# Patient Record
Sex: Female | Born: 1995 | Race: Black or African American | Hispanic: No | Marital: Single | Smoking: Former smoker
Health system: Southern US, Community
[De-identification: ages and names within clinical notes are randomized; demographics above are authoritative.]

## PROBLEM LIST (undated history)

## (undated) DIAGNOSIS — I10 Essential (primary) hypertension: Secondary | ICD-10-CM

## (undated) DIAGNOSIS — E559 Vitamin D deficiency, unspecified: Secondary | ICD-10-CM

## (undated) DIAGNOSIS — D649 Anemia, unspecified: Secondary | ICD-10-CM

## (undated) DIAGNOSIS — N189 Chronic kidney disease, unspecified: Secondary | ICD-10-CM

## (undated) HISTORY — PX: NO PAST SURGERIES: SHX2092

## (undated) HISTORY — DX: Essential (primary) hypertension: I10

## (undated) HISTORY — DX: Chronic kidney disease, unspecified: N18.9

---
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
## 2017-09-08 IMAGING — CT CT NECK W/ CM
4 of 5 series · 15 of 33 positions shown, 17 images · IV contrast (iopamidol)
Comparison: None.

CLINICAL DATA: Initial evaluation for 4 day history of acute sore
throat.

EXAM:
CT NECK WITH CONTRAST
TECHNIQUE: Multidetector CT imaging of the neck was performed using the
standard protocol following the bolus administration of intravenous
contrast.
CONTRAST:  75mL 4FSLW9-XOO IOPAMIDOL (4FSLW9-XOO) INJECTION 61%

[Series 3: neck with st · axial · 0.41mm/px · z∈[-229,-113]mm · 3 of 118 slices shown, 4 images]
[im 30/118  soft-tissue]
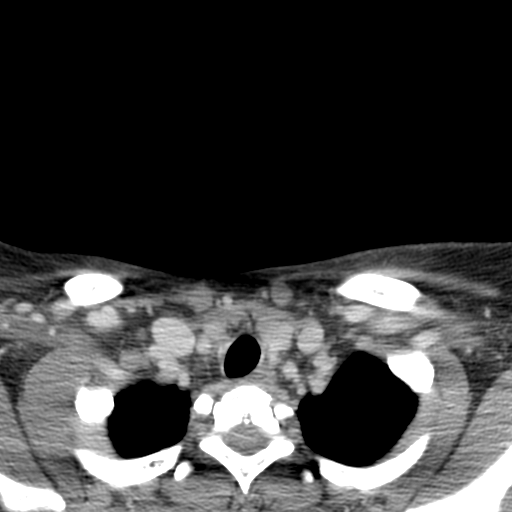
[im 30/118  bone]
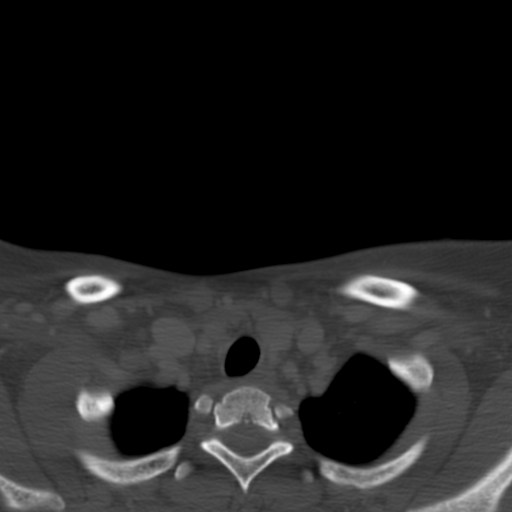
[im 59/118  bone]
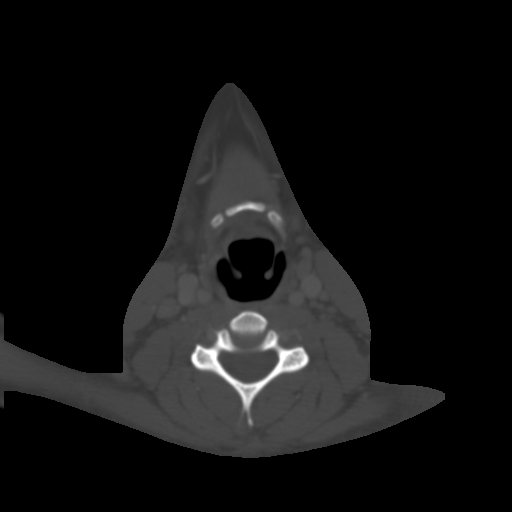
[im 88/118  bone]
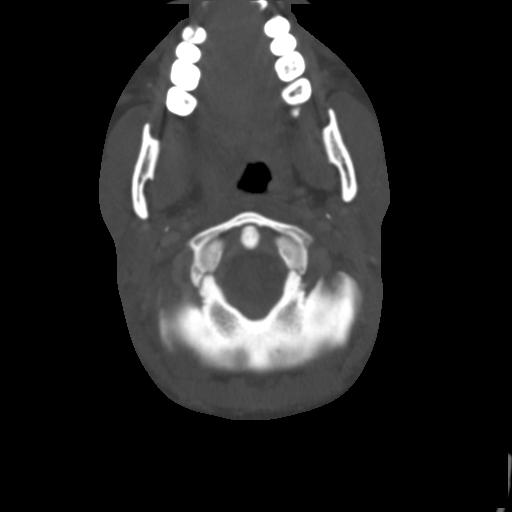

[Series 6: coronal st · coronal · 0.51mm/px · 3 of 98 slices shown]
[im 33/98  bone]
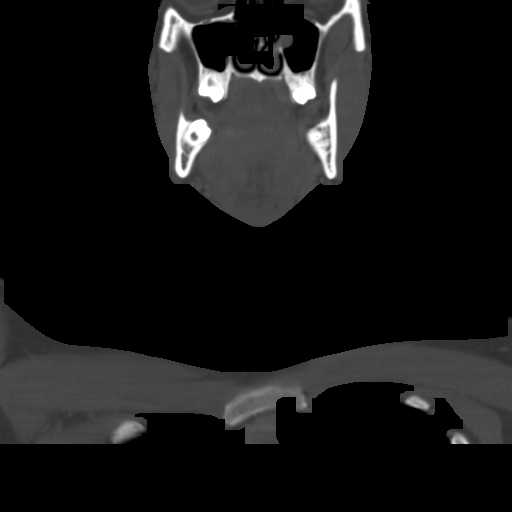
[im 44/98  bone]
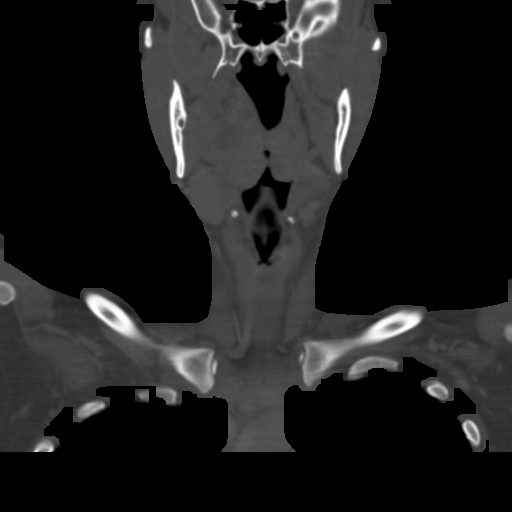
[im 55/98  bone]
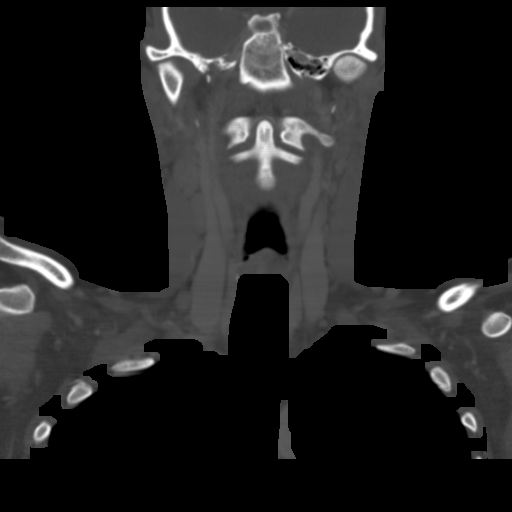

[Series 7: sagittal st · sagittal · 0.43mm/px · 5 of 101 slices shown, 6 images]
[im 34/101  bone]
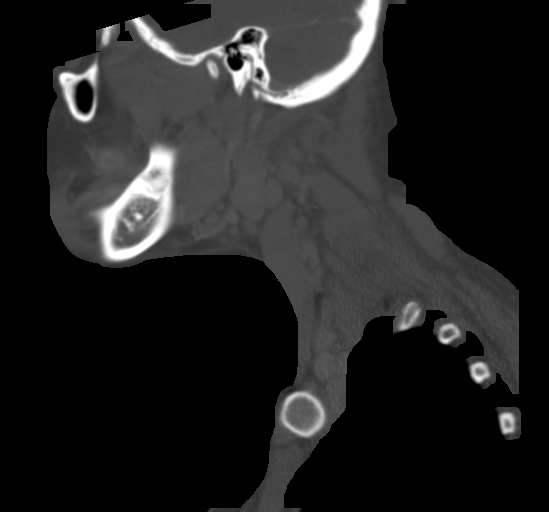
[im 42/101  bone]
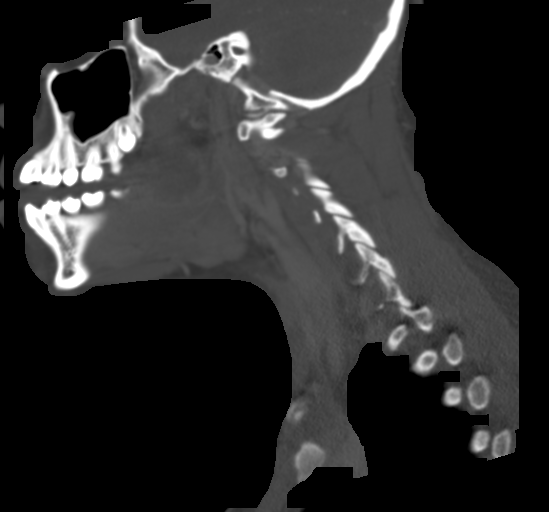
[im 51/101  soft-tissue]
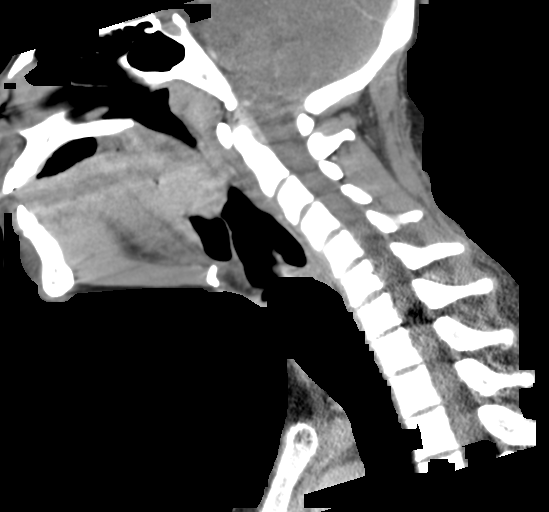
[im 51/101  bone]
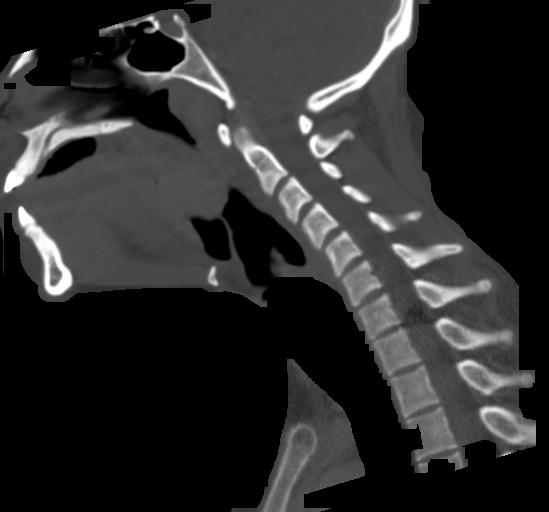
[im 59/101  bone]
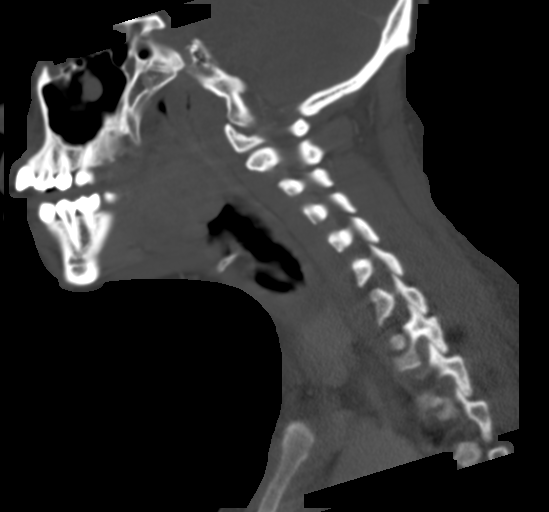
[im 67/101  bone]
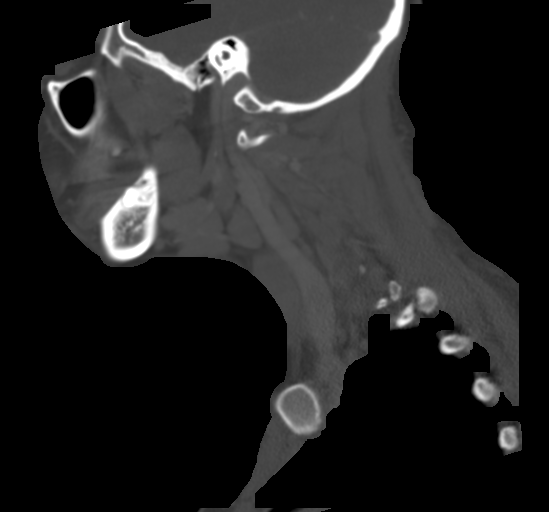

[Series 8: axial recons · axial · 0.39mm/px · z∈[-248,-131]mm · 4 of 117 slices shown]
[im 24/117  bone]
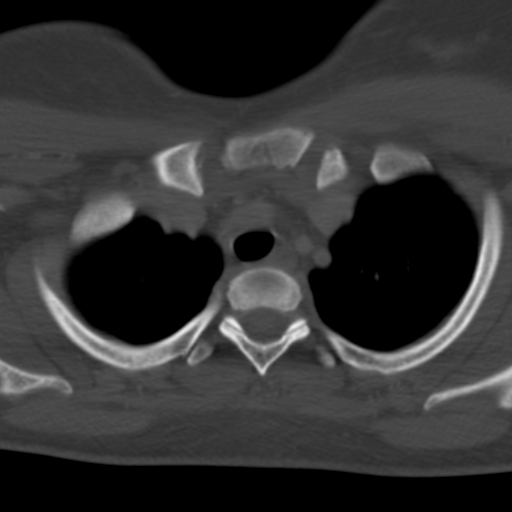
[im 47/117  bone]
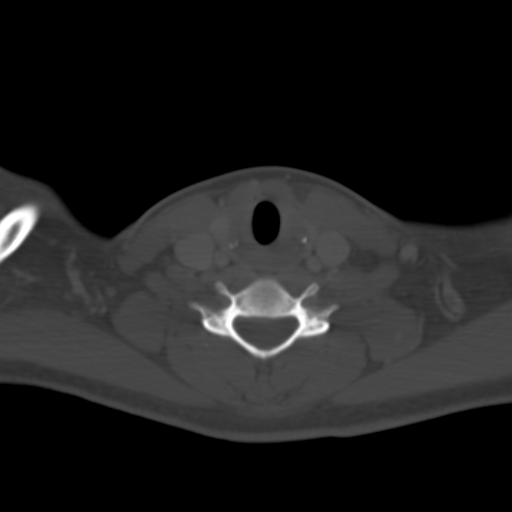
[im 70/117  bone]
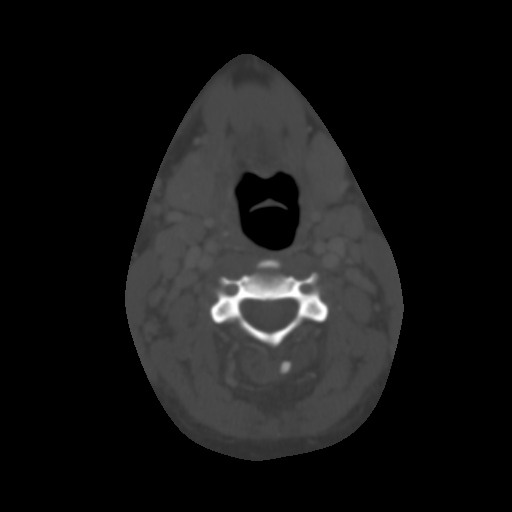
[im 93/117  bone]
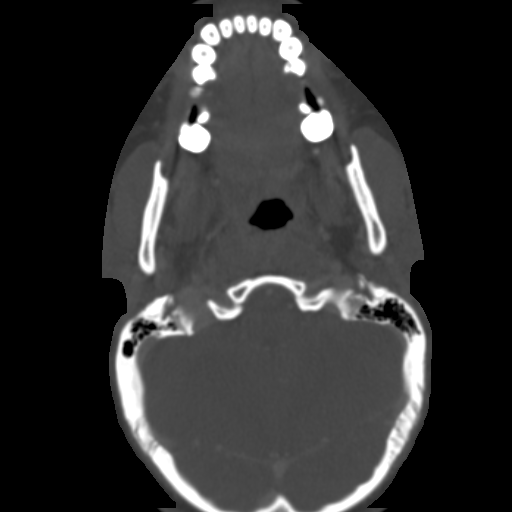

[15 of 33 positions shown; findings below may reference images not displayed]

FINDINGS: Visualized portions of the brain are within normal limits.
Visualized globes and orbits unremarkable.

Mild polypoid mucosal thickening within the maxillary sinuses.
Remainder the visualized paranasal sinuses are otherwise clear. No
mastoid effusion. Middle ear cavities are well pneumatized.

Salivary glands including the parotid glands and submandibular
glands are normal.

Oral cavity within normal limits. Visualized dentition unremarkable.

Palatine tonsils are enlarged and hyper enhancing, compatible with
acute tonsillitis. Tonsils abut at the midline. There is a loculated
hypodense rim enhancing collection just lateral to the right tonsil
that measures 2.8 x 2.0 x 2.5 cm, compatible with peritonsillar
abscess (Series 3, image 36). While there is some anterior extension
of this collection towards the posterior right mandibular molars,
just medial to the retromolar trigone, no significant dental caries
or other abnormalities to suggest an odontogenic origin are seen.
Associated induration and inflammatory stranding within the right
parapharyngeal fat.

Nasopharynx within normal limits. No retropharyngeal fluid
collection. Epiglottis normal. Vallecula clear. Remainder of the
hypopharynx and supraglottic larynx are normal. True cords symmetric
and normal. Subglottic airway clear.

Thyroid gland normal.

Mildly prominent bilateral level 2 lymph nodes measure up to 13 mm
on the right and 11 mm on the left, likely reactive.

Visualized mediastinum within normal limits.

Visualized lungs are clear.

Normal intravascular enhancement seen within the neck.

No acute osseous abnormality. No worrisome lytic or blastic osseous
lesions.
IMPRESSION: 1. Findings compatible with acute tonsillitis. Associated 2.8 x
x 2.5 cm right peritonsillar abscess as above.
2. Mildly prominent bilateral level 2 lymph nodes, likely reactive.

## 2018-06-07 ENCOUNTER — Telehealth: Payer: Self-pay | Admitting: *Deleted

## 2018-06-07 NOTE — Telephone Encounter (Signed)
Patient called wanting to schedule a appointment for pregnancy, patient states she is [redacted] weeks pregnant and she hasn't seen a doctor in a while. Please advise [phone]

## 2018-06-10 NOTE — Telephone Encounter (Signed)
I called the patient, left a message with her roommate to call back.

## 2018-06-10 NOTE — Telephone Encounter (Signed)
Patient called checking the status of this appointment, patient states she was in [place] Dr Mikey Bussing. Patient states she got a release form and that office told her they faxed over her records. Please advise

## 2018-06-14 NOTE — Telephone Encounter (Signed)
Patient called, stated that she has tried to get an appointment with [place] for two weeks.  I advised her that we needed [place] of state, once we have them our lead nurse will review and she will advised [place] what type of appointment to schedule her.  She stated she had the records sent, I advised her that we have not received anything yet.  She is going to request them to be faxed.

## 2018-06-25 ENCOUNTER — Telehealth: Payer: Self-pay | Admitting: *Deleted

## 2018-06-25 ENCOUNTER — Other Ambulatory Visit: Payer: Self-pay | Admitting: Obstetrics & Gynecology

## 2018-06-25 DIAGNOSIS — Z363 Encounter for antenatal screening for malformations: Secondary | ICD-10-CM

## 2018-06-25 NOTE — Telephone Encounter (Signed)
Patient informed that we are not allowing visitors or children to come to appointments at this time. Patient denies any contact with anyone suspected or confirmed of having COVID-19. Pt denies fever, cough, sob, muscle pain, diarrhea, rash, vomiting, abdominal pain, red eye, weakness, bruising or bleeding, joint pain or severe headache.  

## 2018-06-26 ENCOUNTER — Other Ambulatory Visit: Payer: Self-pay

## 2018-06-26 ENCOUNTER — Ambulatory Visit (INDEPENDENT_AMBULATORY_CARE_PROVIDER_SITE_OTHER): Payer: Self-pay | Admitting: Obstetrics and Gynecology

## 2018-06-26 ENCOUNTER — Encounter: Payer: Self-pay | Admitting: Obstetrics and Gynecology

## 2018-06-26 ENCOUNTER — Ambulatory Visit: Payer: Self-pay | Admitting: *Deleted

## 2018-06-26 ENCOUNTER — Ambulatory Visit (INDEPENDENT_AMBULATORY_CARE_PROVIDER_SITE_OTHER): Payer: Self-pay

## 2018-06-26 VITALS — BP 150/95 | HR 90 | Wt 185.0 lb

## 2018-06-26 DIAGNOSIS — Z148 Genetic carrier of other disease: Secondary | ICD-10-CM

## 2018-06-26 DIAGNOSIS — Z3A20 20 weeks gestation of pregnancy: Secondary | ICD-10-CM

## 2018-06-26 DIAGNOSIS — Z363 Encounter for antenatal screening for malformations: Secondary | ICD-10-CM

## 2018-06-26 DIAGNOSIS — Z1389 Encounter for screening for other disorder: Secondary | ICD-10-CM

## 2018-06-26 DIAGNOSIS — Z331 Pregnant state, incidental: Secondary | ICD-10-CM

## 2018-06-26 DIAGNOSIS — O10912 Unspecified pre-existing hypertension complicating pregnancy, second trimester: Secondary | ICD-10-CM

## 2018-06-26 DIAGNOSIS — Z8619 Personal history of other infectious and parasitic diseases: Secondary | ICD-10-CM

## 2018-06-26 DIAGNOSIS — O0990 Supervision of high risk pregnancy, unspecified, unspecified trimester: Secondary | ICD-10-CM

## 2018-06-26 DIAGNOSIS — O0992 Supervision of high risk pregnancy, unspecified, second trimester: Secondary | ICD-10-CM

## 2018-06-26 DIAGNOSIS — O10919 Unspecified pre-existing hypertension complicating pregnancy, unspecified trimester: Secondary | ICD-10-CM

## 2018-06-26 LAB — POCT URINALYSIS DIPSTICK OB
Blood, UA: NEGATIVE
Glucose, UA: NEGATIVE
Ketones, UA: NEGATIVE
Leukocytes, UA: NEGATIVE
Nitrite, UA: NEGATIVE

## 2018-06-26 NOTE — Progress Notes (Addendum)
[place] 20+5 wks,breech,cx 4.1 cm,posterior placenta gr 0,normal ovaries bilat,fhr 161 bpm,svp of fluid 4 cm,efw 372 g 44%,limited view of heart because of fetal position,please have pt come back for additional images,no obvious abnormalities

## 2018-06-26 NOTE — Progress Notes (Signed)
Pt did not take BP med this am.

## 2018-06-28 DIAGNOSIS — Z8619 Personal history of other infectious and parasitic diseases: Secondary | ICD-10-CM

## 2018-06-28 DIAGNOSIS — Z148 Genetic carrier of other disease: Secondary | ICD-10-CM | POA: Insufficient documentation

## 2018-06-28 DIAGNOSIS — A749 Chlamydial infection, unspecified: Secondary | ICD-10-CM | POA: Insufficient documentation

## 2018-06-28 NOTE — Progress Notes (Signed)
HIGH-RISK PREGNANCY VISIT Patient name: Shannon Juarez MRN [id number]  Date of birth: [birth year] Chief Complaint:   [place] from [place]. History of Present Illness:   Shannon Juarez is a 23 y.o. G1P0 female at 9135w0d with an Estimated Date of Delivery: 11/08/18 being seen today for ongoing management of a high-risk pregnancy complicated by chronic HTN.,  With documented renal disease, serum creatinine was 1.5 in 2017, this pregnancy creatinine is been documented at 1.21 and 1.1 she has documented baseline proteinuria with 24-hour total protein of 1900 mg in the early second trimester Today she reports no complaints.  She did not take her Procardia this morning she is on Procardia XL 30 once daily  Contractions: Not present. Vag. Bleeding: None.  Movement: Present. denies leaking of fluid.  Review of Systems:   Pertinent items are noted in HPI Denies abnormal vaginal discharge w/ itching/odor/irritation, headaches, visual changes, shortness of breath, chest pain, abdominal pain, severe nausea/vomiting, or problems with urination or bowel movements unless otherwise stated above. Pertinent History Reviewed:  Reviewed past medical,surgical, social, obstetrical and family history.  Reviewed problem list, medications and allergies. Physical Assessment:   Vitals:   06/26/18 1104  BP: (!) 150/95  Pulse: 90  Weight: 185 lb (83.9 kg)  Body mass index is 29.86 kg/m.           Physical Examination:   General appearance: alert, well appearing, and in no distress, oriented to person, place, and time and normal appearing weight  Mental status: alert, oriented to person, place, and time, normal mood, behavior, speech, dress, motor activity, and thought processes  Skin: warm & dry   Extremities: Edema: None    Cardiovascular: normal heart rate noted  Respiratory: normal respiratory effort, no distress  Abdomen: gravid, soft, non-tender  Pelvic: Cervical exam deferred          Fetal Status:     Movement: Present    Fetal Surveillance Testing today: growth u/s: TECHNICIAN COMMENTS:    US 20+5 wks,breech,cx 4.1 cm,posterior placenta gr 0,normal ovaries bilat,fhr 161 bpm,svp of fluid 4 cm,efw 372 g 44%,limited view of heart because of fetal position,please have pt come back for additional images,no obvious abnormalities   E. [employer]. [employer] Pontmber J Carl 06/26/2018 11:11 AM   Assessment & Plan:  1) High-risk pregnancy G1P0 at 935w0d with an Estimated Date of Delivery: 11/08/18   2) CHTN, stable will INCREASE PROCARDIA XL TO 30 BID.  3) RENAL DISEASE, stable                 WILL check 24 hr tp at 28, 36 wk. 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  Meds: No orders of the defined types were placed in this encounter.   Labs/procedures today: GC/CHL  Treatment Plan:  1 Increase procardia xl to 30 bid q 2 wk visit second trimester,(encourage BP cuff purchase),  2 24 hr TP at 28 , 26 wk 3 Ante testing BPP q wk p 28 wk. Growth scan q 4 wk.  4 Chl proof of cure today                                      Reviewed:  labor symptoms and general obstetric precautions including but not limited to vaginal bleeding, contractions, leaking of fluid  and fetal movement were reviewed in detail with the patient.  All questions were answered.  Follow-up: No follow-ups on file.  Orders Placed This Encounter  Procedures  . GC/Chlamydia Probe Amp  . Comprehensive metabolic panel  . CBC  . Comprehensive metabolic panel  . HIV Antibody (routine testing w rflx)  . POC Urinalysis Dipstick OB   Tilda Burrow CNM, [facility] 06/28/2018 9:13 AM

## 2018-06-29 LAB — GC/CHLAMYDIA PROBE AMP
Chlamydia trachomatis, NAA: NEGATIVE
Neisseria Gonorrhoeae by PCR: NEGATIVE

## 2018-07-03 ENCOUNTER — Telehealth: Payer: Self-pay | Admitting: Obstetrics and Gynecology

## 2018-07-03 NOTE — Telephone Encounter (Signed)
Pt states that when she came in for her appt she had not taken her BP med that morning. She said during the appt Dr. Emelda Fear to take her BP med twice daily due to her BP being elevated. Pt is wanting to know since she did not take her medication that morning if she should still take the medication twice daily?

## 2018-07-03 NOTE — Telephone Encounter (Signed)
Patient states she wanted to make Dr Emelda Fear aware that she did not take her BP medication the morning of her appointment.  She was advised to take 2 tablets daily but does not want to take until he was aware.  I informed her that it would depend on her blood pressure and since she is not taking her BP at home, then

## 2018-07-22 ENCOUNTER — Other Ambulatory Visit: Payer: Self-pay | Admitting: Obstetrics and Gynecology

## 2018-07-22 DIAGNOSIS — O10919 Unspecified pre-existing hypertension complicating pregnancy, unspecified trimester: Secondary | ICD-10-CM

## 2018-07-23 ENCOUNTER — Encounter: Payer: Self-pay | Admitting: *Deleted

## 2018-07-24 ENCOUNTER — Ambulatory Visit (INDEPENDENT_AMBULATORY_CARE_PROVIDER_SITE_OTHER): Payer: Medicaid Other

## 2018-07-24 ENCOUNTER — Ambulatory Visit (INDEPENDENT_AMBULATORY_CARE_PROVIDER_SITE_OTHER): Payer: Medicaid Other | Admitting: Obstetrics and Gynecology

## 2018-07-24 ENCOUNTER — Other Ambulatory Visit: Payer: Self-pay

## 2018-07-24 ENCOUNTER — Encounter: Payer: Self-pay | Admitting: Obstetrics and Gynecology

## 2018-07-24 ENCOUNTER — Encounter: Payer: Self-pay | Admitting: Women's Health

## 2018-07-24 VITALS — BP 137/81 | HR 97 | Temp 98.4°F | Wt 192.0 lb

## 2018-07-24 DIAGNOSIS — Z3A24 24 weeks gestation of pregnancy: Secondary | ICD-10-CM

## 2018-07-24 DIAGNOSIS — O0990 Supervision of high risk pregnancy, unspecified, unspecified trimester: Secondary | ICD-10-CM

## 2018-07-24 DIAGNOSIS — Z362 Encounter for other antenatal screening follow-up: Secondary | ICD-10-CM | POA: Diagnosis not present

## 2018-07-24 DIAGNOSIS — Z1389 Encounter for screening for other disorder: Secondary | ICD-10-CM

## 2018-07-24 DIAGNOSIS — O0992 Supervision of high risk pregnancy, unspecified, second trimester: Secondary | ICD-10-CM

## 2018-07-24 DIAGNOSIS — Z331 Pregnant state, incidental: Secondary | ICD-10-CM

## 2018-07-24 DIAGNOSIS — O10919 Unspecified pre-existing hypertension complicating pregnancy, unspecified trimester: Secondary | ICD-10-CM

## 2018-07-24 LAB — POCT URINALYSIS DIPSTICK OB
Ketones, UA: NEGATIVE
Nitrite, UA: NEGATIVE

## 2018-07-24 NOTE — Progress Notes (Signed)
Patient ID: Shannon Juarez, female   DOB: 1996/02/09, 23 y.o.   MRN: [id number]    [facility] PREGNANCY VISIT Patient name: Shannon Juarez MRN [id number]  Date of birth: 30-Jun-1995 Chief Complaint:   High Risk Gestation ([place] today; hurts to put on pants and when walking in pelvic area; trouble sleeping; belly button is soft )  History of Present Illness:   Shannon Juarez is a 23 y.o. G1P0 female at [redacted]w[redacted]d with an Estimated Date of Delivery: 11/08/18 being seen today for ongoing management of a high-risk pregnancy complicated by chronic HTN. Has renal disease. Has pain when she put on pants but has no swelling. Doesn't take bp medication BID but is taking it more often. Today she reports pain with putting on pants , This is just a sense of vulvar pressure but otherwise unremarkable.  This is just a sense of vulvar pressure but otherwise unremarkable. Contractions: Not present. Vag. Bleeding: None.  Movement: Present. denies leaking of fluid.  Review of Systems:   Pertinent items are noted in HPI Denies abnormal vaginal discharge w/ itching/odor/irritation, headaches, visual changes, shortness of breath, chest pain, abdominal pain, severe nausea/vomiting, or problems with urination or bowel movements unless otherwise stated above. Pertinent History Reviewed:  Reviewed past medical,surgical, social, obstetrical and family history.  Reviewed problem list, medications and allergies. Physical Assessment:   Vitals:   07/24/18 1042  BP: 137/81  Pulse: 97  Temp: 98.4 F (36.9 C)  Weight: 192 lb (87.1 kg)  Body mass index is 30.99 kg/m.           Physical Examination:   General appearance: alert, well appearing, and in no distress  Mental status: alert, oriented to person, place, and time, normal mood, behavior, speech, dress, motor activity, and thought processes, affect appropriate to mood  Skin: warm & dry   Extremities: Edema: None    Cardiovascular: normal heart rate noted  Respiratory: normal  respiratory effort, no distress  Abdomen: gravid, soft, non-tender  Pelvic: Cervical exam deferred         Fetal Status: Fetal Heart Rate (bpm): 154 Fundal Height: 25 cm Movement: Present    Fetal Surveillance Testing today: [place] 24+5 wks,breech,posterior placenta gr 0,svp of fluid 6.3 cm,cx 3.6 cm,anatomy of the heart complete,no obvious abnormalities,efw 692 g 27 %  Results for orders placed or performed in visit on 07/24/18 (from the past 24 hour(s))  POC Urinalysis Dipstick OB   Collection Time: 07/24/18 10:35 AM  Result Value Ref Range   Color, UA     Clarity, UA     Glucose, UA Trace (A) Negative   Bilirubin, UA     Ketones, UA neg    Spec Grav, UA     Blood, UA trace    pH, UA     POC,PROTEIN,UA Large (3+) Negative, Trace, Small (1+), Moderate (2+), Large (3+), 4+   Urobilinogen, UA     Nitrite, UA neg    Leukocytes, UA Trace (A) Negative   Appearance     Odor      Assessment & Plan:  1) High-risk pregnancy G1P0 at [redacted]w[redacted]d with an Estimated Date of Delivery: 11/08/18   2) CHTN, stable  3) Genetic carrier MY07A, stable  4) Umbilical weakness, stable  Meds: No orders of the defined types were placed in this encounter.   Labs/procedures today: [place] 24+5 wks,breech,posterior placenta gr 0,svp of fluid 6.3 cm,cx 3.6 cm,anatomy of the heart complete,no obvious abnormalities,efw 692 g 27 %  Treatment Plan: 1.  F/u 4 weeks for PN2 and CMET  Follow-up: Return in about 4 weeks (around 08/21/2018) for PN2 (sugar test).  Orders Placed This Encounter  Procedures  . POC Urinalysis Dipstick OB   By signing my name below, I, Arnette NorrisMari Johnson, attest that this documentation has been prepared under the direction and in the presence of Tilda BurrowFerguson, Erynne Kealey V, MD. Electronically Signed: Arnette NorrisMari Johnson Medical Scribe. 07/24/18. 11:20 AM.  I personally performed the services described in this documentation, which was SCRIBED in my presence. The recorded information has been reviewed and considered  accurate. It has been edited as necessary during review. Tilda BurrowJohn V Kalliopi Coupland, MD

## 2018-07-24 NOTE — Patient Instructions (Signed)
[facility] [place] °Call to Register: [phone] or [phone]   or   Register Online: [email or web address] °THESE CLASSES FILL UP VERY QUICKLY, SO SIGN UP AS SOON AS YOU CAN!!! °Please visit [facility] pregnancy website at [email or web address] ° °Childbirth Classes  °Option 1: Birth & Baby Series °? Series of 3 weekly classes, on the same day of the week (can choose Mon-Thurs) from 6-9pm °? Helps you and your support person prepare for childbirth °? Reviews newborn care, labor & birth, cesarean birth, pain management, and comfort techniques °? Cost: $60 per couple for insured or self-pay, $30 per couple for Medicaid ° °Option 2: Weekend Birth & Baby °? This class is a weekend version of our Birth & Baby series.  It is designed for parents who have a difficult time fitting several weeks of classes into their schedule.   °? Covers the care of your newborn and the basics of labor and childbirth °? Friday 6:30pm-8:30pm Saturday 9am-4pm, includes lunch for you and your partner  °? Cost: $75 per couple for insured or self-pay, $30 per couple for Medicaid ° °Option 3: Natural Childbirth °? This series of 5 weekly classes is for expectant parents who want to learn and practice natural methods of coping with the process of labor and childbirth.  Can choose Mon or Tues, 7-9pm.   °? Covers relaxation, breathing, massage, visualization, role of the partner, and helpful positioning °? Participants learn how to be confident in their body's ability to give birth. Class empowers and helps parents make informed decisions about care. Includes discussion that will help new parents transition into the immediate postpartum period.  °? Cost: $75 per couple for insured or self-pay, $30 per couple for Medicaid ° °Option 4: Online Birth & Baby °? This online class offers you the freedom to complete a Birth & Baby series in the comfort of your own home.  The flexibility of this option allows you to review  sections at your own pace, at times convenient to you and your support people.  It includes additional video information, animations, quizzes and extended activities. Get organized with helpful eClass tools, checklists, and trackers.  °? Cost: $60 for 60 days of online access °    °                                                                       °Other Available Classes ° °Baby & Me °Enjoy this time to discuss newborn & infant parenting topics and family adjustment issues with other new mothers in a relaxed environment. Each week brings a new speaker or baby-centered activity. We encourage mothers and their babies (birth to crawling) to join us. You are welcome to visit this group even if you haven't delivered yet! It's wonderful to make new friends early and watch other moms interact with their babies. No registration or fee.  °Big Brother/Big Sister °Let your children share in the joy of a new brother or sister in this special class designed just for them. Discussion includes how families care for babies: swaddling, holding, diapering, safety, as well as how they can be helpful in their new role. This class is designed for children ages 2 to 6, but any age is   welcome. Please register each child individually. $5 °Breastfeeding Support Group °This group is a mother-to-mother support circle where moms have the opportunity to share their breastfeeding experiences. A Breastfeeding Support nurse is present for questions and concerns. An infant scale is available for weight checks. No fee or registration.  °Breastfeeding Your Baby °Breastfeeding is a special time for mother and child. This class will help you feel ready to begin this important relationship. Your partner is encouraged to attend with you. Learn what to expect and feel more confident in the first days of breastfeeding your newborn. This class also addresses the most common fears and challenges of breastfeeding during the first few weeks, months, and  beyond. $30 per couple °Caring for Baby °This class is for expectant and adoptive parents who want to learn and practice the most up-to-date newborn care for their babies. Focus is on birth through first six weeks of life. Topics include feeding, bathing, diapering, crying, umbilical cord care, circumcision care and safe sleep. Parents learn how to recognize symptoms of illness and when to call the pediatrician. Register only the mom-to-be and your partner can plan to come with you. (*Note: This class is included in the Birth & Baby series and the Weekend Birth & Baby classes.) $10 per couple °Comfort Techniques & Tour °This 2-hour interactive class is designed for those who either do not wish to take the Birth & Baby series or for those who prefer our online childbirth class, but don't want to miss the opportunity to learn and practice hands-on techniques. These skills can help relieve some of the discomfort of labor and encourage your baby to rotate toward the best position for birth. You and your partner will be able to try a variety of labor positions with birth balls and rebozos as well as practice breathing, relaxation, and visual techniques. $20 per couple °Daddy Boot Camp °This course offers Dads-to-be the tools and knowledge needed to feel confident on their journey to becoming new fathers. Experienced dads, who have been trained as coaches, teach dads-to-be how to hold, comfort, diapers, swaddle and play with their infant while being able to support the new mom as well. $25 °Grandparent Love °Expecting a grandbaby? Learn about the latest infant care and safety recommendations and ways to support your own child as he or she transitions into the parenting role. $10 per person °Infant and Child CPR °Parents, grandparents, babysitters, and friends learn Cardio-Pulmonary Resuscitation skills for infants and children. You will also learn how to treat both conscious and unconscious choking infants and children.  Register each participant individually. (Note: This Family & Friends program does not offer certification.) $20 per person °Marvelous Multiples °Expecting twins, triplets, or more? This free 2-hour class covers the differences in labor, birth, parenting, and breastfeeding issues that face multiples' parents.  °Maternity Care Center Virtual Tour ° Online virtual tour of the new [facility] [place] ° °Mom Talk °This free mom-led group offers support and connection to mothers as they journey through the adjustments and struggles of that sometimes overwhelming first year after the birth of a child. A member of our staff will be present to share resources and additional support if needed, as you care for yourself and baby. You are welcome to visit this group before you deliver! It's wonderful to meet new friends early and watch other moms interact with their babies.  °Waterbirth Class °Interested in a waterbirth? This free informational class will help you discover whether   waterbirth is the right fit for you and is required if you are planning a waterbirth. Education about waterbirth itself, supplies you may need, and what you may need from your support team is included in this class. Partners are encouraged to come. °  ° °

## 2018-07-24 NOTE — Progress Notes (Signed)
[place] 24+5 wks,breech,posterior placenta gr 0,svp of fluid 6.3 cm,cx 3.6 cm,anatomy of the heart complete,no obvious abnormalities,efw 692 g 27 %

## 2018-08-08 ENCOUNTER — Other Ambulatory Visit: Payer: Self-pay | Admitting: Women's Health

## 2018-08-08 ENCOUNTER — Encounter: Payer: Self-pay | Admitting: *Deleted

## 2018-08-08 ENCOUNTER — Telehealth: Payer: Self-pay | Admitting: Women's Health

## 2018-08-08 MED ORDER — NIFEDIPINE ER 30 MG PO TB24: 30.0000 mg | ORAL_TABLET | Freq: Two times a day (BID) | ORAL | 6 refills | Status: DC

## 2018-08-08 NOTE — Telephone Encounter (Signed)
Pt requesting a refill of her NIFEdipine. She would like the medication to go to [facility] in [place].

## 2018-08-21 ENCOUNTER — Encounter: Payer: Self-pay | Admitting: *Deleted

## 2018-08-22 ENCOUNTER — Other Ambulatory Visit: Payer: Self-pay

## 2018-08-22 ENCOUNTER — Other Ambulatory Visit: Payer: Medicaid Other

## 2018-08-22 ENCOUNTER — Ambulatory Visit (INDEPENDENT_AMBULATORY_CARE_PROVIDER_SITE_OTHER): Payer: Medicaid Other | Admitting: Women's Health

## 2018-08-22 ENCOUNTER — Encounter: Payer: Self-pay | Admitting: Women's Health

## 2018-08-22 VITALS — BP 143/88 | HR 104 | Wt 193.0 lb

## 2018-08-22 DIAGNOSIS — O0993 Supervision of high risk pregnancy, unspecified, third trimester: Secondary | ICD-10-CM

## 2018-08-22 DIAGNOSIS — A749 Chlamydial infection, unspecified: Secondary | ICD-10-CM

## 2018-08-22 DIAGNOSIS — Z3A28 28 weeks gestation of pregnancy: Secondary | ICD-10-CM

## 2018-08-22 DIAGNOSIS — Z331 Pregnant state, incidental: Secondary | ICD-10-CM

## 2018-08-22 DIAGNOSIS — O0990 Supervision of high risk pregnancy, unspecified, unspecified trimester: Secondary | ICD-10-CM

## 2018-08-22 DIAGNOSIS — O10919 Unspecified pre-existing hypertension complicating pregnancy, unspecified trimester: Secondary | ICD-10-CM

## 2018-08-22 DIAGNOSIS — O98811 Other maternal infectious and parasitic diseases complicating pregnancy, first trimester: Secondary | ICD-10-CM

## 2018-08-22 DIAGNOSIS — Z23 Encounter for immunization: Secondary | ICD-10-CM | POA: Diagnosis not present

## 2018-08-22 DIAGNOSIS — O98812 Other maternal infectious and parasitic diseases complicating pregnancy, second trimester: Secondary | ICD-10-CM

## 2018-08-22 DIAGNOSIS — Z1389 Encounter for screening for other disorder: Secondary | ICD-10-CM

## 2018-08-22 DIAGNOSIS — N189 Chronic kidney disease, unspecified: Secondary | ICD-10-CM

## 2018-08-22 DIAGNOSIS — Z3403 Encounter for supervision of normal first pregnancy, third trimester: Secondary | ICD-10-CM

## 2018-08-22 DIAGNOSIS — O10912 Unspecified pre-existing hypertension complicating pregnancy, second trimester: Secondary | ICD-10-CM

## 2018-08-22 LAB — POCT URINALYSIS DIPSTICK OB
Blood, UA: NEGATIVE
Glucose, UA: NEGATIVE
Ketones, UA: NEGATIVE
Leukocytes, UA: NEGATIVE
Nitrite, UA: NEGATIVE

## 2018-08-22 NOTE — Patient Instructions (Signed)
Shannon Juarez, I greatly value your feedback.  If you receive a survey following your visit with Shannon Juarez today, we appreciate you taking the time to fill it out.  Thanks, Shannon Juarez, CNM, [facility]  [facility]!!! It is now [facility] ([address] [place], [place] [address]) Entrance located off of [address] Free 24/7 valet parking   Home Blood Pressure Monitoring for Patients   Check your blood pressure 4 times daily and keep a log, bring this with you to all appointments.  If blood pressure is >=160 on top or >=110 on bottom, check again in 5 minutes, if still this high, call Shannon Juarez or go to [facility] to be evaluated   Helpful Tips for Accurate Home Blood Pressure Checks  . Don't smoke, exercise, or drink caffeine 30 minutes before checking your BP . Use the restroom before checking your BP (a full bladder can raise your pressure) . Relax in a comfortable upright chair . Feet on the ground . Left arm resting comfortably on a flat surface at the level of your heart . Legs uncrossed . Back supported . Sit quietly and don't talk . Place the cuff on your bare arm . Adjust snuggly, so that only two fingertips can fit between your skin and the top of the cuff . Check 2 readings separated by at least one minute . Keep a log of your BP readings . For a visual, please reference this diagram: http://ccnc.care/bpdiagram  Provider Name: [facility]     Phone: [phone]  Zone 1: ALL CLEAR  Continue to monitor your symptoms:  . BP reading is less than 140 (top number) or less than 90 (bottom number)  . No right upper stomach pain . No headaches or seeing spots . No feeling nauseated or throwing up . No swelling in face and hands  Zone 2: CAUTION Call your doctor's office for any of the following:  . BP reading is greater than 140 (top number) or greater than 90 (bottom number)  . Stomach pain under your ribs in the middle or right  side . Headaches or seeing spots . Feeling nauseated or throwing up . Swelling in face and hands  Zone 3: EMERGENCY  Seek immediate medical care if you have any of the following:  . BP reading is greater than160 (top number) or greater than 110 (bottom number) . Severe headaches not improving with Tylenol . Serious difficulty catching your breath . Any worsening symptoms from Zone 2     Call the office [phone]) or go to [facility] if:  You begin to have strong, frequent contractions  Your water breaks.  Sometimes it is a big gush of fluid, sometimes it is just a trickle that keeps getting your panties wet or running down your legs  You have vaginal bleeding.  It is normal to have a small amount of spotting if your cervix was checked.   You don't feel your baby moving like normal.  If you don't, get you something to eat and drink and lay down and focus on feeling your baby move.  You should feel at least 10 movements in 2 hours.  If you don't, you should call the office or go to [facility].   Call the office [phone]) or go to [facility] for these signs of pre-eclampsia:  Severe headache that does not go away with Tylenol  Visual changes- seeing spots, double, blurred vision  Pain under your right breast or upper abdomen that does not go away with Tums or heartburn medicine  Nausea and/or vomiting  Severe swelling in your hands, feet, and face   Tdap Vaccine  It is recommended that you get the Tdap vaccine during the third trimester of EACH pregnancy to help protect your baby from getting pertussis (whooping cough)  27-36 weeks is the BEST time to do this so that you can pass the protection on to your baby. During pregnancy is better than after pregnancy, but if you are unable to get it during pregnancy it will be offered at the hospital.   You can get this vaccine with [place], at the health department, your family doctor, or some local pharmacies  Everyone  who will be around your baby should also be up-to-date on their vaccines before the baby comes. Adults (who are not pregnant) only need 1 dose of Tdap during adulthood.       Third Trimester of Pregnancy The third trimester is from week 28 through week 40 (months 7 through 9). The third trimester is a time when the unborn baby (fetus) is growing rapidly. At the end of the ninth month, the fetus is about 20 inches in length and weighs 6-10 pounds. Body changes during your third trimester Your body will continue to go through many changes during pregnancy. The changes vary from woman to woman. During the third trimester:  Your weight will continue to increase. You can expect to gain 25-35 pounds (11-16 kg) by the end of the pregnancy.  You may begin to get stretch marks on your hips, abdomen, and breasts.  You may urinate more often because the fetus is moving lower into your pelvis and pressing on your bladder.  You may develop or continue to have heartburn. This is caused by increased hormones that slow down muscles in the digestive tract.  You may develop or continue to have constipation because increased hormones slow digestion and cause the muscles that push waste through your intestines to relax.  You may develop hemorrhoids. These are swollen veins (varicose veins) in the rectum that can itch or be painful.  You may develop swollen, bulging veins (varicose veins) in your legs.  You may have increased body aches in the pelvis, back, or thighs. This is due to weight gain and increased hormones that are relaxing your joints.  You may have changes in your hair. These can include thickening of your hair, rapid growth, and changes in texture. Some women also have hair loss during or after pregnancy, or hair that feels dry or thin. Your hair will most likely return to normal after your baby is born.  Your breasts will continue to grow and they will continue to become tender. A yellow fluid  (colostrum) may leak from your breasts. This is the first milk you are producing for your baby.  Your belly button may stick out.  You may notice more swelling in your hands, face, or ankles.  You may have increased tingling or numbness in your hands, arms, and legs. The skin on your belly may also feel numb.  You may feel short of breath because of your expanding uterus.  You may have more problems sleeping. This can be caused by the size of your belly, increased need to urinate, and an increase in your body's metabolism.  You may notice the fetus "dropping," or moving lower in your abdomen (lightening).  You may have increased vaginal discharge.  You  may notice your joints feel loose and you may have pain around your pelvic bone. What to expect at prenatal visits You will have prenatal exams every 2 weeks until week 36. Then you will have weekly prenatal exams. During a routine prenatal visit:  You will be weighed to make sure you and the baby are growing normally.  Your blood pressure will be taken.  Your abdomen will be measured to track your baby's growth.  The fetal heartbeat will be listened to.  Any test results from the previous visit will be discussed.  You may have a cervical check near your due date to see if your cervix has softened or thinned (effaced).  You will be tested for Group B streptococcus. This happens between 35 and 37 weeks. Your health care provider may ask you:  What your birth plan is.  How you are feeling.  If you are feeling the baby move.  If you have had any abnormal symptoms, such as leaking fluid, bleeding, severe headaches, or abdominal cramping.  If you are using any tobacco products, including cigarettes, chewing tobacco, and electronic cigarettes.  If you have any questions. Other tests or screenings that may be performed during your third trimester include:  Blood tests that check for low iron levels (anemia).  Fetal testing to  check the health, activity level, and growth of the fetus. Testing is done if you have certain medical conditions or if there are problems during the pregnancy.  Nonstress test (NST). This test checks the health of your baby to make sure there are no signs of problems, such as the baby not getting enough oxygen. During this test, a belt is placed around your belly. The baby is made to move, and its heart rate is monitored during movement. What is false labor? False labor is a condition in which you feel small, irregular tightenings of the muscles in the womb (contractions) that usually go away with rest, changing position, or drinking water. These are called Braxton Hicks contractions. Contractions may last for hours, days, or even weeks before true labor sets in. If contractions come at regular intervals, become more frequent, increase in intensity, or become painful, you should see your health care provider. What are the signs of labor?  Abdominal cramps.  Regular contractions that start at 10 minutes apart and become stronger and more frequent with time.  Contractions that start on the top of the uterus and spread down to the lower abdomen and back.  Increased pelvic pressure and dull back pain.  A watery or bloody mucus discharge that comes from the vagina.  Leaking of amniotic fluid. This is also known as your "water breaking." It could be a slow trickle or a gush. Let your health care provider know if it has a color or strange odor. If you have any of these signs, call your health care provider right away, even if it is before your due date. Follow these instructions at home: Medicines  Follow your health care provider's instructions regarding medicine use. Specific medicines may be either safe or unsafe to take during pregnancy.  Take a prenatal vitamin that contains at least 600 micrograms (mcg) of folic acid.  If you develop constipation, try taking a stool softener if your health  care provider approves. Eating and drinking   Eat a balanced diet that includes fresh fruits and vegetables, whole grains, good sources of protein such as meat, eggs, or tofu, and low-fat dairy. Your health care provider will  help you determine the amount of weight gain that is right for you.  Avoid raw meat and uncooked cheese. These carry germs that can cause birth defects in the baby.  If you have low calcium intake from food, talk to your health care provider about whether you should take a daily calcium supplement.  Eat four or five small meals rather than three large meals a day.  Limit foods that are high in fat and processed sugars, such as fried and sweet foods.  To prevent constipation: ? Drink enough fluid to keep your urine clear or pale yellow. ? Eat foods that are high in fiber, such as fresh fruits and vegetables, whole grains, and beans. Activity  Exercise only as directed by your health care provider. Most women can continue their usual exercise routine during pregnancy. Try to exercise for 30 minutes at least 5 days a week. Stop exercising if you experience uterine contractions.  Avoid heavy lifting.  Do not exercise in extreme heat or humidity, or at high altitudes.  Wear low-heel, comfortable shoes.  Practice good posture.  You may continue to have sex unless your health care provider tells you otherwise. Relieving pain and discomfort  Take frequent breaks and rest with your legs elevated if you have leg cramps or low back pain.  Take warm sitz baths to soothe any pain or discomfort caused by hemorrhoids. Use hemorrhoid cream if your health care provider approves.  Wear a good support bra to prevent discomfort from breast tenderness.  If you develop varicose veins: ? Wear support pantyhose or compression stockings as told by your healthcare provider. ? Elevate your feet for 15 minutes, 3-4 times a day. Prenatal care  Write down your questions. Take them  to your prenatal visits.  Keep all your prenatal visits as told by your health care provider. This is important. Safety  Wear your seat belt at all times when driving.  Make a list of emergency phone numbers, including numbers for family, friends, the hospital, and police and fire departments. General instructions  Avoid cat litter boxes and soil used by cats. These carry germs that can cause birth defects in the baby. If you have a cat, ask someone to clean the litter box for you.  Do not travel far distances unless it is absolutely necessary and only with the approval of your health care provider.  Do not use hot tubs, steam rooms, or saunas.  Do not drink alcohol.  Do not use any products that contain nicotine or tobacco, such as cigarettes and e-cigarettes. If you need help quitting, ask your health care provider.  Do not use any medicinal herbs or unprescribed drugs. These chemicals affect the formation and growth of the baby.  Do not douche or use tampons or scented sanitary pads.  Do not cross your legs for long periods of time.  To prepare for the arrival of your baby: ? Take prenatal classes to understand, practice, and ask questions about labor and delivery. ? Make a trial run to the hospital. ? Visit the hospital and tour the maternity area. ? Arrange for maternity or paternity leave through employers. ? Arrange for family and friends to take care of pets while you are in the hospital. ? Purchase a rear-facing car seat and make sure you know how to install it in your car. ? Pack your hospital bag. ? Prepare the baby's nursery. Make sure to remove all pillows and stuffed animals from the baby's crib to prevent  suffocation.  Visit your dentist if you have not gone during your pregnancy. Use a soft toothbrush to brush your teeth and be gentle when you floss. Contact a health care provider if:  You are unsure if you are in labor or if your water has broken.  You become  dizzy.  You have mild pelvic cramps, pelvic pressure, or nagging pain in your abdominal area.  You have lower back pain.  You have persistent nausea, vomiting, or diarrhea.  You have an unusual or bad smelling vaginal discharge.  You have pain when you urinate. Get help right away if:  Your water breaks before 37 weeks.  You have regular contractions less than 5 minutes apart before 37 weeks.  You have a fever.  You are leaking fluid from your vagina.  You have spotting or bleeding from your vagina.  You have severe abdominal pain or cramping.  You have rapid weight loss or weight gain.  You have shortness of breath with chest pain.  You notice sudden or extreme swelling of your face, hands, ankles, feet, or legs.  Your baby makes fewer than 10 movements in 2 hours.  You have severe headaches that do not go away when you take medicine.  You have vision changes. Summary  The third trimester is from week 28 through week 40, months 7 through 9. The third trimester is a time when the unborn baby (fetus) is growing rapidly.  During the third trimester, your discomfort may increase as you and your baby continue to gain weight. You may have abdominal, leg, and back pain, sleeping problems, and an increased need to urinate.  During the third trimester your breasts will keep growing and they will continue to become tender. A yellow fluid (colostrum) may leak from your breasts. This is the first milk you are producing for your baby.  False labor is a condition in which you feel small, irregular tightenings of the muscles in the womb (contractions) that eventually go away. These are called Braxton Hicks contractions. Contractions may last for hours, days, or even weeks before true labor sets in.  Signs of labor can include: abdominal cramps; regular contractions that start at 10 minutes apart and become stronger and more frequent with time; watery or bloody mucus discharge that  comes from the vagina; increased pelvic pressure and dull back pain; and leaking of amniotic fluid. This information is not intended to replace advice given to you by your health care provider. Make sure you discuss any questions you have with your health care provider. Document Released: 02/21/2001 Document Revised: 04/04/2016 Document Reviewed: 04/04/2016 Elsevier Interactive Patient Education  2019 [employer]
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
## 2018-08-23 LAB — COMPREHENSIVE METABOLIC PANEL
ALT: 16 IU/L (ref 0–32)
AST: 32 IU/L (ref 0–40)
Albumin/Globulin Ratio: 1.4 (ref 1.2–2.2)
Albumin: 3.7 g/dL — ABNORMAL LOW (ref 3.9–5.0)
Alkaline Phosphatase: 82 IU/L (ref 39–117)
BUN/Creatinine Ratio: 9 (ref 9–23)
BUN: 11 mg/dL (ref 6–20)
Bilirubin Total: <0.2 mg/dL (ref 0.0–1.2)
CO2: 17 mmol/L — ABNORMAL LOW (ref 20–29)
Calcium: 9.5 mg/dL (ref 8.7–10.2)
Chloride: 103 mmol/L (ref 96–106)
Creatinine, Ser: 1.21 mg/dL — ABNORMAL HIGH (ref 0.57–1.00)
GFR calc Af Amer: 74 mL/min/{1.73_m2} (ref >59)
GFR calc non Af Amer: 64 mL/min/{1.73_m2} (ref >59)
Globulin, Total: 2.6 g/dL (ref 1.5–4.5)
Glucose: 191 mg/dL — ABNORMAL HIGH (ref 65–99)
Potassium: 3.8 mmol/L (ref 3.5–5.2)
Sodium: 137 mmol/L (ref 134–144)
Total Protein: 6.3 g/dL (ref 6.0–8.5)

## 2018-08-23 LAB — CBC
Hematocrit: 26.7 % — ABNORMAL LOW (ref 34.0–46.6)
Hemoglobin: 8.7 g/dL — ABNORMAL LOW (ref 11.1–15.9)
MCH: 25.1 pg — ABNORMAL LOW (ref 26.6–33.0)
MCHC: 32.6 g/dL (ref 31.5–35.7)
MCV: 77 fL — ABNORMAL LOW (ref 79–97)
Platelets: 341 10*3/uL (ref 150–450)
RBC: 3.47 x10E6/uL — ABNORMAL LOW (ref 3.77–5.28)
RDW: 15.2 % (ref 11.7–15.4)
WBC: 6.9 10*3/uL (ref 3.4–10.8)

## 2018-08-23 LAB — GLUCOSE TOLERANCE, 2 HOURS W/ 1HR
Glucose, 1 hour: 200 mg/dL — ABNORMAL HIGH (ref 65–179)
Glucose, 2 hour: 148 mg/dL (ref 65–152)
Glucose, Fasting: 86 mg/dL (ref 65–91)

## 2018-08-23 LAB — ANTIBODY SCREEN: Antibody Screen: NEGATIVE

## 2018-08-23 LAB — ABO/RH: Rh Factor: POSITIVE

## 2018-08-23 LAB — PROTEIN / CREATININE RATIO, URINE
Creatinine, Urine: 108.5 mg/dL
Protein, Ur: 121.5 mg/dL
Protein/Creat Ratio: 1120 mg/g creat — ABNORMAL HIGH (ref 0–200)

## 2018-08-23 LAB — RPR: RPR Ser Ql: NONREACTIVE

## 2018-08-23 LAB — RUBELLA SCREEN: Rubella Antibodies, IGG: 6.25 index (ref >0.99)

## 2018-08-23 LAB — SICKLE CELL SCREEN: Sickle Cell Screen: NEGATIVE

## 2018-08-23 LAB — HIV ANTIBODY (ROUTINE TESTING W REFLEX): HIV Screen 4th Generation wRfx: NONREACTIVE

## 2018-08-27 ENCOUNTER — Encounter: Payer: Self-pay | Admitting: *Deleted

## 2018-08-27 ENCOUNTER — Other Ambulatory Visit: Payer: Self-pay | Admitting: *Deleted

## 2018-08-27 ENCOUNTER — Other Ambulatory Visit: Payer: Self-pay | Admitting: Women's Health

## 2018-08-27 DIAGNOSIS — O24410 Gestational diabetes mellitus in pregnancy, diet controlled: Secondary | ICD-10-CM

## 2018-08-27 MED ORDER — BLOOD GLUCOSE METER KIT: PACK | 0 refills | Status: DC

## 2018-08-27 MED ORDER — FERROUS SULFATE 325 (65 FE) MG PO TABS: 325.0000 mg | ORAL_TABLET | Freq: Two times a day (BID) | ORAL | 3 refills | Status: DC

## 2018-09-02 ENCOUNTER — Encounter: Payer: Medicaid Other | Admitting: Women's Health

## 2018-09-02 ENCOUNTER — Other Ambulatory Visit: Payer: Medicaid Other

## 2018-09-09 ENCOUNTER — Telehealth: Payer: Self-pay | Admitting: *Deleted

## 2018-09-09 NOTE — Telephone Encounter (Signed)
Called patient to come in and see [facility] for GDM. The patient stated she would not have a way to get here.

## 2018-09-12 ENCOUNTER — Other Ambulatory Visit: Payer: Self-pay | Admitting: *Deleted

## 2018-09-12 ENCOUNTER — Telehealth: Payer: Self-pay | Admitting: *Deleted

## 2018-09-12 ENCOUNTER — Telehealth: Payer: Self-pay | Admitting: Advanced Practice Midwife

## 2018-09-12 ENCOUNTER — Encounter: Payer: Self-pay | Admitting: *Deleted

## 2018-09-12 DIAGNOSIS — O0990 Supervision of high risk pregnancy, unspecified, unspecified trimester: Secondary | ICD-10-CM

## 2018-09-12 DIAGNOSIS — O24419 Gestational diabetes mellitus in pregnancy, unspecified control: Secondary | ICD-10-CM

## 2018-09-12 MED ORDER — ACCU-CHEK GUIDE ME W/DEVICE KIT: 1.0000 | PACK | Freq: Four times a day (QID) | 0 refills | Status: DC

## 2018-09-12 MED ORDER — ACCU-CHEK FASTCLIX LANCETS MISC: 1.0000 | Freq: Four times a day (QID) | 12 refills | Status: DC

## 2018-09-12 MED ORDER — ACCU-CHEK GUIDE VI STRP: ORAL_STRIP | 12 refills | Status: DC

## 2018-09-12 NOTE — Telephone Encounter (Signed)
PLease call pt she is 31 weeks and needs to speak to a nurse about passing out today and what she should do

## 2018-09-12 NOTE — Telephone Encounter (Signed)
Patient states she had an episode of "passing out" this morning and wants to know why.  When asked if she sie is checking her blood sugar, patient stated "no" as she has not been able to afford the strips.  Informed I would check with her pharmacy as this is usually covered by [employer].  Also, her BS may be fluctuating and may be a cause of her episode. Discussed about importance of checking CBG's.pt verbalized understanding.

## 2018-09-17 ENCOUNTER — Other Ambulatory Visit: Payer: Self-pay | Admitting: Women's Health

## 2018-09-17 DIAGNOSIS — O10919 Unspecified pre-existing hypertension complicating pregnancy, unspecified trimester: Secondary | ICD-10-CM

## 2018-09-17 DIAGNOSIS — O0993 Supervision of high risk pregnancy, unspecified, third trimester: Secondary | ICD-10-CM

## 2018-09-17 DIAGNOSIS — N189 Chronic kidney disease, unspecified: Secondary | ICD-10-CM

## 2018-09-20 ENCOUNTER — Other Ambulatory Visit: Payer: Medicaid Other

## 2018-09-20 ENCOUNTER — Encounter: Payer: Medicaid Other | Admitting: Women's Health

## 2018-09-25 ENCOUNTER — Other Ambulatory Visit: Payer: Medicaid Other

## 2018-10-01 ENCOUNTER — Telehealth: Payer: Self-pay | Admitting: Women's Health

## 2018-10-01 NOTE — Telephone Encounter (Signed)
Pt has missed a few appts and wanting to come in. What should she be scheduled for?

## 2018-10-11 ENCOUNTER — Other Ambulatory Visit: Payer: Medicaid Other

## 2018-10-11 ENCOUNTER — Encounter: Payer: Medicaid Other | Admitting: Obstetrics and Gynecology

## 2019-01-31 ENCOUNTER — Encounter (HOSPITAL_COMMUNITY): Payer: Self-pay

## 2020-02-14 ENCOUNTER — Emergency Department (HOSPITAL_COMMUNITY)
Admission: EM | Admit: 2020-02-14 | Discharge: 2020-02-15 | Disposition: A | Discharge status: Home / Self Care | Payer: Medicaid Other | Attending: Emergency Medicine | Admitting: Emergency Medicine

## 2020-02-14 ENCOUNTER — Encounter (HOSPITAL_COMMUNITY): Payer: Self-pay | Admitting: Emergency Medicine

## 2020-02-14 ENCOUNTER — Other Ambulatory Visit: Payer: Self-pay

## 2020-02-14 DIAGNOSIS — Z5321 Procedure and treatment not carried out due to patient leaving prior to being seen by health care provider: Secondary | ICD-10-CM | POA: Insufficient documentation

## 2020-02-14 DIAGNOSIS — R55 Syncope and collapse: Secondary | ICD-10-CM | POA: Diagnosis present

## 2020-02-14 HISTORY — DX: Vitamin D deficiency, unspecified: E55.9

## 2020-02-14 HISTORY — DX: Anemia, unspecified: D64.9

## 2020-02-14 LAB — URINALYSIS, ROUTINE W REFLEX MICROSCOPIC
Bilirubin Urine: NEGATIVE
Glucose, UA: NEGATIVE mg/dL
Ketones, ur: NEGATIVE mg/dL
Leukocytes,Ua: NEGATIVE
Nitrite: NEGATIVE
Protein, ur: >=300 mg/dL — AB
Specific Gravity, Urine: 1.013 (ref 1.005–1.030)
pH: 5 (ref 5.0–8.0)

## 2020-02-14 LAB — CBG MONITORING, ED: Glucose-Capillary: 80 mg/dL (ref 70–99)

## 2020-02-14 LAB — POC URINE PREG, ED: Preg Test, Ur: NEGATIVE

## 2020-02-14 NOTE — ED Triage Notes (Signed)
Pt c/o loss of consciousness for the past 4 days. Pt states she is unable to count how many times it has happened today. Pt denies falling, loss of bowel or bladder, or DM.

## 2021-04-18 ENCOUNTER — Other Ambulatory Visit (HOSPITAL_COMMUNITY)
Admission: RE | Admit: 2021-04-18 | Discharge: 2021-04-18 | Disposition: A | Discharge status: Home / Self Care | Payer: Medicaid Other | Source: Ambulatory Visit | Attending: Adult Health | Admitting: Adult Health

## 2021-04-18 ENCOUNTER — Encounter: Payer: Self-pay | Admitting: Adult Health

## 2021-04-18 ENCOUNTER — Other Ambulatory Visit: Payer: Self-pay

## 2021-04-18 ENCOUNTER — Ambulatory Visit (INDEPENDENT_AMBULATORY_CARE_PROVIDER_SITE_OTHER): Payer: Medicaid Other | Admitting: Adult Health

## 2021-04-18 VITALS — BP 148/96 | HR 84 | Ht 64.0 in | Wt 210.0 lb

## 2021-04-18 DIAGNOSIS — I10 Essential (primary) hypertension: Secondary | ICD-10-CM

## 2021-04-18 DIAGNOSIS — Z124 Encounter for screening for malignant neoplasm of cervix: Secondary | ICD-10-CM | POA: Insufficient documentation

## 2021-04-18 DIAGNOSIS — N939 Abnormal uterine and vaginal bleeding, unspecified: Secondary | ICD-10-CM | POA: Diagnosis not present

## 2021-04-18 MED ORDER — MEGESTROL ACETATE 40 MG PO TABS: ORAL_TABLET | ORAL | 0 refills | Status: DC

## 2021-04-18 NOTE — Progress Notes (Signed)
°  Subjective:     Patient ID: Shannon Juarez, female   DOB: 05-22-1995, 26 y.o.   MRN: [id number]  HPI Shannon Juarez is a 26 year old black female,single, G1P1 in complaining of bleeding, was seen in ER in [place], and took megace for 7 days and it was lighter but still bleeding, it started 03/13/21. She needs pap.  Review of Systems Bleeding since 03/13/21 +clots No cramps Reviewed past medical,surgical, social and family history. Reviewed medications and allergies.     Objective:   Physical Exam BP (!) 148/96 (BP Location: Right Arm, Patient Position: Sitting, Cuff Size: Normal)    Pulse 84    Ht 5\' 4"  (1.626 m)    Wt 210 lb (95.3 kg)    LMP 03/13/2021 (Exact Date) Comment: bleeding slowed down with megestrol but never went away   Breastfeeding No    BMI 36.05 kg/m     Skin warm and dry.Pelvic: external genitalia is normal in appearance no lesions, vagina: +blood,urethra has no lesions or masses noted, cervix:smooth and bulbous,pap with GC/CHL performed, uterus: normal size, shape and contour, non tender, no masses felt, adnexa: no masses or tenderness noted. Bladder is non tender and no masses felt.   Upstream - 04/18/21 1421       Pregnancy Intention Screening   Does the patient want to become pregnant in the next year? No    Does the patient's partner want to become pregnant in the next year? No    Would the patient like to discuss contraceptive options today? No      Contraception Wrap Up   Current Method No Contraceptive Precautions    End Method No Contraception Precautions    Contraception Counseling Provided No            Examination chaperoned by 06/16/21 LPN  Assessment:     1. Abnormal uterine bleeding (AUB) Will rx megace to stop bleeding Will get pelvic Faith Rogue to assess uterus and ovaries 04/25/21 at [facility] at 2:30 pm  2. Routine Papanicolaou smear Pap sent Repeat in 3 years if normal   3. Chronic hypertension Will recheck in 2 weeks She is not on meds     Plan:     Follow up with me in 2 weeks to review [facility] and ROS Request ER records and labs from [place]

## 2021-04-20 LAB — CYTOLOGY - PAP
Chlamydia: NEGATIVE
Comment: NEGATIVE
Comment: NORMAL
Diagnosis: NEGATIVE
Neisseria Gonorrhea: NEGATIVE

## 2021-04-25 ENCOUNTER — Other Ambulatory Visit: Payer: Self-pay

## 2021-04-25 ENCOUNTER — Other Ambulatory Visit: Payer: Self-pay | Admitting: Adult Health

## 2021-04-25 ENCOUNTER — Ambulatory Visit (HOSPITAL_COMMUNITY)
Admission: RE | Admit: 2021-04-25 | Discharge: 2021-04-25 | Disposition: A | Discharge status: Home / Self Care | Payer: Medicaid Other | Source: Ambulatory Visit | Attending: Adult Health | Admitting: Adult Health

## 2021-04-25 DIAGNOSIS — N939 Abnormal uterine and vaginal bleeding, unspecified: Secondary | ICD-10-CM | POA: Diagnosis present

## 2021-05-02 ENCOUNTER — Ambulatory Visit: Payer: Medicaid Other | Admitting: Adult Health

## 2021-05-10 ENCOUNTER — Other Ambulatory Visit: Payer: Self-pay | Admitting: Adult Health

## 2021-05-24 ENCOUNTER — Other Ambulatory Visit: Payer: Self-pay | Admitting: Adult Health

## 2021-05-30 ENCOUNTER — Emergency Department (HOSPITAL_COMMUNITY): Payer: Medicaid Other

## 2021-05-30 ENCOUNTER — Encounter (HOSPITAL_COMMUNITY): Payer: Self-pay | Admitting: *Deleted

## 2021-05-30 ENCOUNTER — Emergency Department (HOSPITAL_COMMUNITY)
Admission: EM | Admit: 2021-05-30 | Discharge: 2021-05-30 | Disposition: A | Discharge status: Home / Self Care | Payer: Medicaid Other | Attending: Emergency Medicine | Admitting: Emergency Medicine

## 2021-05-30 ENCOUNTER — Other Ambulatory Visit: Payer: Self-pay

## 2021-05-30 DIAGNOSIS — N1832 Chronic kidney disease, stage 3b: Secondary | ICD-10-CM | POA: Insufficient documentation

## 2021-05-30 DIAGNOSIS — D649 Anemia, unspecified: Secondary | ICD-10-CM | POA: Insufficient documentation

## 2021-05-30 DIAGNOSIS — Z79899 Other long term (current) drug therapy: Secondary | ICD-10-CM | POA: Diagnosis not present

## 2021-05-30 DIAGNOSIS — H4710 Unspecified papilledema: Secondary | ICD-10-CM | POA: Diagnosis not present

## 2021-05-30 DIAGNOSIS — R519 Headache, unspecified: Secondary | ICD-10-CM

## 2021-05-30 DIAGNOSIS — I129 Hypertensive chronic kidney disease with stage 1 through stage 4 chronic kidney disease, or unspecified chronic kidney disease: Secondary | ICD-10-CM | POA: Insufficient documentation

## 2021-05-30 LAB — BASIC METABOLIC PANEL
Anion gap: 9 (ref 5–15)
BUN: 17 mg/dL (ref 6–20)
CO2: 22 mmol/L (ref 22–32)
Calcium: 9.1 mg/dL (ref 8.9–10.3)
Chloride: 107 mmol/L (ref 98–111)
Creatinine, Ser: 1.69 mg/dL — ABNORMAL HIGH (ref 0.44–1.00)
GFR, Estimated: 43 mL/min — ABNORMAL LOW (ref >60)
Glucose, Bld: 99 mg/dL (ref 70–99)
Potassium: 3.9 mmol/L (ref 3.5–5.1)
Sodium: 138 mmol/L (ref 135–145)

## 2021-05-30 LAB — URINALYSIS, ROUTINE W REFLEX MICROSCOPIC
Bilirubin Urine: NEGATIVE
Glucose, UA: 50 mg/dL — AB
Hgb urine dipstick: NEGATIVE
Ketones, ur: NEGATIVE mg/dL
Leukocytes,Ua: NEGATIVE
Nitrite: NEGATIVE
Protein, ur: >=300 mg/dL — AB
Specific Gravity, Urine: 1.018 (ref 1.005–1.030)
pH: 6 (ref 5.0–8.0)

## 2021-05-30 LAB — CBC WITH DIFFERENTIAL/PLATELET
Abs Immature Granulocytes: 0.01 10*3/uL (ref 0.00–0.07)
Basophils Absolute: 0 10*3/uL (ref 0.0–0.1)
Basophils Relative: 1 %
Eosinophils Absolute: 0.1 10*3/uL (ref 0.0–0.5)
Eosinophils Relative: 1 %
HCT: 28.3 % — ABNORMAL LOW (ref 36.0–46.0)
Hemoglobin: 8.1 g/dL — ABNORMAL LOW (ref 12.0–15.0)
Immature Granulocytes: 0 %
Lymphocytes Relative: 32 %
Lymphs Abs: 2.1 10*3/uL (ref 0.7–4.0)
MCH: 21.4 pg — ABNORMAL LOW (ref 26.0–34.0)
MCHC: 28.6 g/dL — ABNORMAL LOW (ref 30.0–36.0)
MCV: 74.9 fL — ABNORMAL LOW (ref 80.0–100.0)
Monocytes Absolute: 0.5 10*3/uL (ref 0.1–1.0)
Monocytes Relative: 7 %
Neutro Abs: 4 10*3/uL (ref 1.7–7.7)
Neutrophils Relative %: 59 %
Platelets: 450 10*3/uL — ABNORMAL HIGH (ref 150–400)
RBC: 3.78 MIL/uL — ABNORMAL LOW (ref 3.87–5.11)
RDW: 20.1 % — ABNORMAL HIGH (ref 11.5–15.5)
WBC: 6.7 10*3/uL (ref 4.0–10.5)
nRBC: 0 % (ref 0.0–0.2)

## 2021-05-30 LAB — GRAM STAIN: Gram Stain: NONE SEEN

## 2021-05-30 LAB — PROTEIN, CSF: Total  Protein, CSF: >600 mg/dL — ABNORMAL HIGH (ref 15–45)

## 2021-05-30 LAB — PREGNANCY, URINE: Preg Test, Ur: NEGATIVE

## 2021-05-30 LAB — GLUCOSE, CSF: Glucose, CSF: 100 mg/dL — ABNORMAL HIGH (ref 40–70)

## 2021-05-30 MED ORDER — KETOROLAC TROMETHAMINE 30 MG/ML IJ SOLN
30.0000 mg | Freq: Once | INTRAMUSCULAR | Status: AC
  Administered 2021-05-30: 30 mg via INTRAVENOUS
  Filled 2021-05-30: qty 1

## 2021-05-30 MED ORDER — AMLODIPINE BESYLATE 5 MG PO TABS: 5.0000 mg | ORAL_TABLET | Freq: Every day | ORAL | 0 refills | Status: AC

## 2021-05-30 MED ORDER — GADOBUTROL 1 MMOL/ML IV SOLN
10.0000 mL | Freq: Once | INTRAVENOUS | Status: AC | PRN
  Administered 2021-05-30: 10 mL via INTRAVENOUS

## 2021-05-30 MED ORDER — METOCLOPRAMIDE HCL 5 MG/ML IJ SOLN
10.0000 mg | Freq: Once | INTRAMUSCULAR | Status: AC
  Administered 2021-05-30: 10 mg via INTRAVENOUS
  Filled 2021-05-30: qty 2

## 2021-05-30 MED ORDER — LORAZEPAM 2 MG/ML IJ SOLN
1.0000 mg | Freq: Once | INTRAMUSCULAR | Status: AC
  Administered 2021-05-30: 1 mg via INTRAVENOUS
  Filled 2021-05-30: qty 1

## 2021-05-30 MED ORDER — SODIUM CHLORIDE 0.9 % IV BOLUS
1000.0000 mL | Freq: Once | INTRAVENOUS | Status: AC
  Administered 2021-05-30: 1000 mL via INTRAVENOUS

## 2021-05-30 MED ORDER — DIPHENHYDRAMINE HCL 50 MG/ML IJ SOLN
12.5000 mg | Freq: Once | INTRAMUSCULAR | Status: AC
  Administered 2021-05-30: 12.5 mg via INTRAVENOUS
  Filled 2021-05-30: qty 1

## 2021-05-30 MED ORDER — AMLODIPINE BESYLATE 5 MG PO TABS
5.0000 mg | ORAL_TABLET | Freq: Once | ORAL | Status: AC
  Administered 2021-05-30: 5 mg via ORAL
  Filled 2021-05-30: qty 1

## 2021-05-30 NOTE — Discharge Instructions (Addendum)
I have asked the interventional radiologists to call you to schedule a lumbar puncture tomorrow.  ? ?You need to follow up with the kidney doctor and the neurologist. ?

## 2021-05-30 NOTE — ED Triage Notes (Signed)
Headache for a month, advised to come in for evaluation. States she hears pulsating in right ear ?

## 2021-05-30 NOTE — ED Provider Notes (Signed)
? EMERGENCY DEPARTMENT ?Provider Note ? ? ?CSN: [id number] ?Arrival date & time: 05/30/21  1410 ? ?  ? ?History ? ?Chief Complaint  ?Patient presents with  ? Headache  ? ? ?Shannon Juarez is a 26 y.o. female. ? ?Pt is a 26 yo female with a hx of DUB, CKD, and HTN.  Pt said she has had a headache for a month.  She has not been able to see well enough to drive, so she went to the optometrist last week.  She said she had some swelling behind her eye and they could not finish the exam and she needed to see neurology.  She called her pcp regarding this problem and they told her to go to the ED.  Pt said her vision goes "out" periodically.  She has some ringing in her right ear.  No weakness in her arms or legs.  She has some numbness to her head. ? ?I called and spoke to the optometrist at [facility] in [place].  She said pt had optic nerve edema. ? ? ?  ? ?Home Medications ?Prior to Admission medications   ?Medication Sig Start Date End Date Taking? Authorizing Provider  ?ALPRAZolam (XANAX) 0.25 MG tablet Take 0.25 mg by mouth every 6 (six) hours as needed for anxiety.   Yes [provider]  ?amLODipine (NORVASC) 5 MG tablet Take 1 tablet (5 mg total) by mouth daily. 05/30/21  Yes Shannon Lefevre, MD  ?Ascorbic Acid (VITAMIN C WITH ROSE HIPS) 500 MG tablet Take 500 mg by mouth daily.   Yes [provider]  ?ferrous sulfate 325 (65 FE) MG EC tablet Take 325 mg by mouth 3 (three) times daily with meals.   Yes [provider]  ?ipratropium (ATROVENT) 0.03 % nasal spray Place into the nose. 09/08/19  Yes [provider]  ?vitamin B-12 (CYANOCOBALAMIN) 500 MCG tablet Take 500 mcg by mouth daily.   Yes [provider]  ?megestrol (MEGACE) 40 MG tablet TAKE 3 TABLETS BY MOUTH FOR 5 DAYS THEN 2 TABS DAILY FOR 5 DAYS THEN 1 TAB DAILY ?Patient not taking: Reported on 05/30/2021 05/10/21   Adline Potter, NP  ?   ? ?Allergies    ?Kiwi extract   ? ?Review of Systems   ?Review of  Systems  ?HENT:  Positive for tinnitus.   ?Eyes:  Positive for visual disturbance.  ?Neurological:  Positive for headaches.  ?All other systems reviewed and are negative. ? ?Physical Exam ?Updated Vital Signs ?BP (!) 146/99   Pulse 87   Temp 98.9 ?F (37.2 ?C) (Oral)   Resp 18   Ht 5\' 4"  (1.626 m)   Wt 90.3 kg   LMP 05/11/2021 (Approximate)   SpO2 100%   BMI 34.16 kg/m?  ?Physical Exam ?Vitals and nursing note reviewed.  ?Constitutional:   ?   Appearance: She is well-developed. She is obese.  ?HENT:  ?   Head: Normocephalic and atraumatic.  ?   Mouth/Throat:  ?   Mouth: Mucous membranes are moist.  ?   Pharynx: Oropharynx is clear.  ?Eyes:  ?   Extraocular Movements: Extraocular movements intact.  ?   Pupils: Pupils are equal, round, and reactive to light.  ?Cardiovascular:  ?   Rate and Rhythm: Normal rate and regular rhythm.  ?   Heart sounds: Normal heart sounds.  ?Pulmonary:  ?   Effort: Pulmonary effort is normal.  ?   Breath sounds: Normal breath sounds.  ?Abdominal:  ?  General: Bowel sounds are normal.  ?   Palpations: Abdomen is soft.  ?Musculoskeletal:     ?   General: Normal range of motion.  ?   Cervical back: Normal range of motion and neck supple.  ?Skin: ?   General: Skin is warm and dry.  ?Neurological:  ?   Mental Status: She is alert and oriented to person, place, and time.  ?Psychiatric:     ?   Mood and Affect: Mood normal.     ?   Speech: Speech normal.     ?   Behavior: Behavior normal.  ? ? ?ED Results / Procedures / Treatments   ?Labs ?(all labs ordered are listed, but only abnormal results are displayed) ?Labs Reviewed  ?BASIC METABOLIC PANEL - Abnormal; Notable for the following components:  ?    Result Value  ? Creatinine, Ser 1.69 (*)   ? GFR, Estimated 43 (*)   ? All other components within normal limits  ?CBC WITH DIFFERENTIAL/PLATELET - Abnormal; Notable for the following components:  ? RBC 3.78 (*)   ? Hemoglobin 8.1 (*)   ? HCT 28.3 (*)   ? MCV 74.9 (*)   ? MCH 21.4 (*)   ?  MCHC 28.6 (*)   ? RDW 20.1 (*)   ? Platelets 450 (*)   ? All other components within normal limits  ?URINALYSIS, ROUTINE W REFLEX MICROSCOPIC - Abnormal; Notable for the following components:  ? Glucose, UA 50 (*)   ? Protein, ur >=300 (*)   ? Bacteria, UA RARE (*)   ? All other components within normal limits  ?GLUCOSE, CSF - Abnormal; Notable for the following components:  ? Glucose, CSF 100 (*)   ? All other components within normal limits  ?PROTEIN, CSF - Abnormal; Notable for the following components:  ? Total  Protein, CSF >600 (*)   ? All other components within normal limits  ?GRAM STAIN  ?CSF CULTURE W GRAM STAIN  ?PREGNANCY, URINE  ?OLIGOCLONAL BANDS, CSF + SERM  ?DRAW EXTRA CLOT TUBE  ?IGG CSF INDEX  ? ? ?EKG ?None ? ?Radiology ?MR Brain W and Wo Contrast ? ?Result Date: 05/30/2021 ?CLINICAL DATA:  Provided history: Optic neuritis suspected. Additional history provided: Headache for 1 month. Patient hears "pulsating "in right ear. EXAM: MRI HEAD AND ORBITS WITHOUT AND WITH CONTRAST TECHNIQUE: Multiplanar, multiecho pulse sequences of the brain and surrounding structures were obtained without and with intravenous contrast. Multiplanar, multiecho pulse sequences of the orbits and surrounding structures were obtained including fat saturation techniques, before and after intravenous contrast administration. CONTRAST:  10mL GADAVIST GADOBUTROL 1 MMOL/ML IV SOLN COMPARISON:  Neck CT 11/20/2015. FINDINGS: MRI HEAD FINDINGS Brain: Cerebral volume is normal. 4 mm focus of T2 FLAIR hyperintense signal abnormality within the posterior right frontal lobe periventricular white matter (series 12, image 23). No significant cerebral white matter disease is identified elsewhere. No cortical encephalomalacia is identified. There is no acute infarct. No evidence of an intracranial mass. No chronic intracranial blood products. No extra-axial fluid collection. No midline shift. No pathologic intracranial enhancement  identified. Vascular: Maintained flow voids within the proximal large arterial vessels. Skull and upper cervical spine: No focal suspicious marrow lesion. MRI ORBITS FINDINGS Orbits: Bilateral proptosis. Subtle symmetric atrophy and T2 hyperintense signal abnormality of the optic nerves is questioned (for instance as seen on series 20, image 14). No MR evidence of acute optic neuritis. The globes are normal in size and contour. The extraocular muscles and  lacrimal glands are symmetric and unremarkable. Visualized sinuses: Small volume fluid scattered within the left ethmoid air cells. Small mucous retention cysts and trace mucosal thickening within the bilateral maxillary sinuses. Soft tissues: The periorbital and visualized maxillofacial soft tissues are unremarkable. IMPRESSION: MRI brain: 1. No evidence of acute intracranial abnormality. 2. Nonspecific 4 mm T2 FLAIR hyperintense remote insult within the posterior right frontal lobe periventricular white matter. Although nonspecific, an early manifestation of demyelinating disease cannot be excluded. 3. Otherwise unremarkable MRI appearance of the brain. MRI orbits: 1. Subtle symmetric atrophy and T2 hyperintense signal abnormality of the optic nerves is questioned, however, this is not definite. Findings could potentially reflect sequela of prior optic neuritis. No MR evidence of acute optic neuritis at this time. 2. A bilateral proptosis. 3. Paranasal sinus disease, as described. Electronically Signed   By: Jackey Loge D.O.   On: 05/30/2021 17:06  ? ?MR ORBITS W WO CONTRAST ? ?Result Date: 05/30/2021 ?CLINICAL DATA:  Provided history: Optic neuritis suspected. Additional history provided: Headache for 1 month. Patient hears "pulsating "in right ear. EXAM: MRI HEAD AND ORBITS WITHOUT AND WITH CONTRAST TECHNIQUE: Multiplanar, multiecho pulse sequences of the brain and surrounding structures were obtained without and with intravenous contrast. Multiplanar,  multiecho pulse sequences of the orbits and surrounding structures were obtained including fat saturation techniques, before and after intravenous contrast administration. CONTRAST:  69mL GADAVIST GADOBUTROL 1 MMOL/ML IV SOLN

## 2021-05-30 NOTE — Progress Notes (Signed)
ON CALL PHONE CONSULT ? ?Call from Dr Particia Nearing @ [facility] ? ?Discussion: ?Pt with one month headache with pulsating in right ear.  ?Optometrist concerned for bilateral optic disc edema (guessing bilateral papilledema) - do not have notes to review.  ?Neuro consult recommended but since has not had that for a week, PCP decided to send to ER. There is no physical neurology service at [facility]. 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 No abnormal enhancement in either the brain or optic nerve also makes an active demyelinating process less likely. The pulsatile quality of the pain in right ear more worrisome for IIHT.  ? ?Recs: ?-LP with opening pressure. ?-If OP>25cm H2o, needs CSF drained to achieve cloing pressure less than 20. ?-Also will need starting acetazolamide 500 BID, outpatient neurology and ophthalmology follow up ?-Tests to be ordered on CSF: Glucose, Protein, Cell count, Gram stain, Oligoclonal bands, IgG index. ?-If bedside LP fails, please request IR guided LP. May needs Obs for this if not available at this hour incase of a failed bedside LP. ?Discussed with Dr. Particia Nearing. ? ?Total of 10 min spent with >50% on consultative call, EHR/imaging review. ? ?-- ?Milon Dikes, MD ?Neurologist ?[facility] ?Pager: [phone] ? ?

## 2021-06-03 LAB — CSF CULTURE W GRAM STAIN: Culture: NO GROWTH

## 2021-06-03 LAB — IGG CSF INDEX
Albumin: 4.3 g/dL (ref 3.9–5.0)
CSF IgG Index: UNDETERMINED
IgG (Immunoglobin G), Serum: 1030 mg/dL (ref 586–1602)
IgG/Alb Ratio, CSF: UNDETERMINED

## 2021-06-03 LAB — OLIGOCLONAL BANDS, CSF + SERM

## 2023-10-15 ENCOUNTER — Ambulatory Visit: Admitting: Adult Health
